# Patient Record
Sex: Female | Born: 1985 | Race: Black or African American | Hispanic: No | Marital: Single | Smoking: Current every day smoker
Health system: Southern US, Community
[De-identification: ages and names within clinical notes are randomized; demographics above are authoritative.]

## PROBLEM LIST (undated history)

## (undated) DIAGNOSIS — F23 Brief psychotic disorder: Secondary | ICD-10-CM

## (undated) DIAGNOSIS — F319 Bipolar disorder, unspecified: Secondary | ICD-10-CM

## (undated) DIAGNOSIS — F4481 Dissociative identity disorder: Secondary | ICD-10-CM

---
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
## 2014-03-07 ENCOUNTER — Emergency Department (HOSPITAL_COMMUNITY)
Admission: EM | Admit: 2014-03-07 | Discharge: 2014-03-07 | Disposition: A | Discharge status: Home / Self Care | Payer: Self-pay | Attending: Emergency Medicine | Admitting: Emergency Medicine

## 2014-03-07 ENCOUNTER — Encounter (HOSPITAL_COMMUNITY): Payer: Self-pay | Admitting: Emergency Medicine

## 2014-03-07 DIAGNOSIS — K0889 Other specified disorders of teeth and supporting structures: Secondary | ICD-10-CM

## 2014-03-07 DIAGNOSIS — K089 Disorder of teeth and supporting structures, unspecified: Secondary | ICD-10-CM | POA: Insufficient documentation

## 2014-03-07 DIAGNOSIS — K029 Dental caries, unspecified: Secondary | ICD-10-CM | POA: Insufficient documentation

## 2014-03-07 DIAGNOSIS — F17200 Nicotine dependence, unspecified, uncomplicated: Secondary | ICD-10-CM | POA: Insufficient documentation

## 2014-03-07 MED ORDER — PROMETHAZINE HCL 25 MG PO TABS: 25.0000 mg | ORAL_TABLET | Freq: Four times a day (QID) | ORAL | Status: DC | PRN

## 2014-03-07 MED ORDER — PENICILLIN V POTASSIUM 500 MG PO TABS: 500.0000 mg | ORAL_TABLET | Freq: Four times a day (QID) | ORAL | Status: DC

## 2014-03-07 MED ORDER — OXYCODONE-ACETAMINOPHEN 5-325 MG PO TABS: 1.0000 | ORAL_TABLET | Freq: Four times a day (QID) | ORAL | Status: DC | PRN

## 2014-03-07 MED ORDER — CLINDAMYCIN HCL 150 MG PO CAPS: 150.0000 mg | ORAL_CAPSULE | Freq: Three times a day (TID) | ORAL | Status: DC

## 2014-03-07 NOTE — ED Provider Notes (Signed)
CSN: [id number]     Arrival date & time 03/07/14  1710 History  This chart was scribed for non-physician practitioner Junious SilkHannah Venna Berberich, PA-C working with Toy BakerAnthony T Allen, MD by Joaquin MusicKristina Sanchez-Matthews, ED Scribe. This patient was seen in room TR06C/TR06C and the patient's care was started at 5:28 PM .   Chief Complaint  Patient presents with  . Dental Pain   The history is provided by the patient. No language interpreter was used.   HPI Comments: Alisha Drake is a 28 y.o. female who presents to the Emergency Department complaining of ongoing 2 upper L sided dental pain that began last night. She states she has a cavity to the tooth that is causing dental pain and suspects she now has an infection. States she is having L face pain due to pain that she states feels she has an infection. Describes the pain as sharp PTA but states her current pain is constant and dull. Pt states heat and cold worsens the pain. Reports not being able to eat due to pain although she has been using OTC Orajel but denies any relief. Pt has allergies to Penicillin. Denies fever, chills, nausea, emesis, CP, SOB, trouble swallowing, drooling, and having a dentist at this time.  History reviewed. No pertinent past medical history. History reviewed. No pertinent past surgical history. History reviewed. No pertinent family history. History  Substance Use Topics  . Smoking status: Current Some Day Smoker  . Smokeless tobacco: Not on file  . Alcohol Use: No   OB History   Grav Para Term Preterm Abortions TAB SAB Ect Mult Living                 Review of Systems  HENT: Positive for dental problem. Negative for drooling and trouble swallowing.   Respiratory: Negative for shortness of breath.   Cardiovascular: Negative for chest pain.  Gastrointestinal: Negative for nausea and vomiting.  All other systems reviewed and are negative.   Allergies  Review of patient's allergies indicates not on file.  Home  Medications   Prior to Admission medications   Not on File   BP 145/98  Pulse 90  Temp(Src) 98.3 F (36.8 C) (Oral)  Resp 18  Ht 5\' 3"  (1.6 m)  Wt 284 lb (128.822 kg)  BMI 50.32 kg/m2  SpO2 100%  LMP 03/01/2014  Physical Exam  Nursing note and vitals reviewed. Constitutional: She is oriented to person, place, and time. She appears well-developed and well-nourished. No distress.  HENT:  Head: Normocephalic and atraumatic.  Right Ear: External ear normal.  Left Ear: External ear normal.  Nose: Nose normal.  Mouth/Throat: Uvula is midline and oropharynx is clear and moist. No trismus in the jaw. Abnormal dentition. Dental caries present.    Poor dentition. No trismus, submental edema, or tongue elevation. No drainable abscess.  Eyes: Conjunctivae are normal.  Neck: Normal range of motion.  Cardiovascular: Normal rate, regular rhythm and normal heart sounds.   Pulmonary/Chest: Effort normal and breath sounds normal. No stridor. No respiratory distress. She has no wheezes. She has no rales.  Abdominal: Soft. She exhibits no distension.  Musculoskeletal: Normal range of motion.  Neurological: She is alert and oriented to person, place, and time. She has normal strength.  Skin: Skin is warm and dry. She is not diaphoretic. No erythema.  Psychiatric: She has a normal mood and affect. Her behavior is normal.   ED Course  Procedures DIAGNOSTIC STUDIES: Oxygen Saturation is 98% on RA, normal  by my interpretation.    COORDINATION OF CARE: 5:31 PM-Discussed treatment plan which includes dental block. Pt agreed to plan.   5:35 PM-Dental Performed by: Junious SilkHannah Jemeka Wagler, PA-C Authorized by: Junious SilkHannah Korbin Mapps, PA-C Consent: Verbal consent obtained. Patient understanding: patient states understanding of the procedure being performed Patient identity confirmed: verbally with patient Local anesthesia used: yes Local anesthetic: bupivacaine 0.5% with epinephrine Anesthetic total: 3.6  ml Patient sedated: no Patient tolerance: Patient tolerated the procedure well with no immediate complications.  5:50 PM-[facility]. Will discharge pt with Clindamycin and Percocet. Pt agreed to plan.  Labs Review Labs Reviewed - No data to display  Imaging Review No results found.   EKG Interpretation None     MDM   Final diagnoses:  Pain, dental    Patient with toothache.  No gross abscess.  Exam unconcerning for Ludwig's angina or spread of infection.  Will treat with penicillin and pain medicine.  Urged patient to follow-up with dentist.    I personally performed the services described in this documentation, which was scribed in my presence. The recorded information has been reviewed and is accurate.    Mora BellmanHannah S Leticia Mcdiarmid, PA-C 03/07/14 [id number]

## 2014-03-07 NOTE — Discharge Instructions (Signed)
Dental Pain °A tooth ache may be caused by cavities (tooth decay). Cavities expose the nerve of the tooth to air and hot or cold temperatures. It may come from an infection or abscess (also called a boil or furuncle) around your tooth. It is also often caused by dental caries (tooth decay). This causes the pain you are having. °DIAGNOSIS  °Your caregiver can diagnose this problem by exam. °TREATMENT  °· If caused by an infection, it may be treated with medications which kill germs (antibiotics) and pain medications as prescribed by your caregiver. Take medications as directed. °· Only take over-the-counter or prescription medicines for pain, discomfort, or fever as directed by your caregiver. °· Whether the tooth ache today is caused by infection or dental disease, you should see your dentist as soon as possible for further care. °SEEK MEDICAL CARE IF: °The exam and treatment you received today has been provided on an emergency basis only. This is not a substitute for complete medical or dental care. If your problem worsens or new problems (symptoms) appear, and you are unable to meet with your dentist, call or return to this location. °SEEK IMMEDIATE MEDICAL CARE IF:  °· You have a fever. °· You develop redness and swelling of your face, jaw, or neck. °· You are unable to open your mouth. °· You have severe pain uncontrolled by pain medicine. °MAKE SURE YOU:  °· Understand these instructions. °· Will watch your condition. °· Will get help right away if you are not doing well or get worse. °Document Released: 10/27/2005 Document Revised: 01/19/2012 Document Reviewed: 06/14/2008 °ExitCare® Patient Information ©2014 [employer]. °  Emergency Department Resource Guide °1) Find a Doctor and Pay Out of Pocket °Although you won't have to find out who is covered by your insurance plan, it is a good idea to ask around and get recommendations. You will then need to call the office and see if the doctor you have chosen will  accept you as a new patient and what types of options they offer for patients who are self-pay. Some doctors offer discounts or will set up payment plans for their patients who do not have insurance, but you will need to ask so you aren't surprised when you get to your appointment. ° °2) Contact Your Local Health Department °Not all health departments have doctors that can see patients for sick visits, but many do, so it is worth a call to see if yours does. If you don't know where your local health department is, you can check in your phone book. The CDC also has a tool to help you locate your state's health department, and many state websites also have listings of all of their local health departments. ° °3) Find a Walk-in Clinic °If your illness is not likely to be very severe or complicated, you may want to try a walk in clinic. These are popping up all over the country in pharmacies, drugstores, and shopping centers. They're usually staffed by nurse practitioners or physician assistants that have been trained to treat common illnesses and complaints. They're usually fairly quick and inexpensive. However, if you have serious medical issues or chronic medical problems, these are probably not your best option. ° °No Primary Care Doctor: °- Call [facility] at  832-8000 - they can help you locate a primary care doctor that  accepts your insurance, provides certain services, etc. °- Physician Referral Service- 1-800-533-3463 ° °Chronic Pain Problems: °Organization           Address  Phone   Notes  °[facility]  (336) 297-2271 Patients need to be referred by their primary care doctor.  ° °Medication Assistance: °Organization         Address  Phone   Notes  °[place] [facility] [address] °[place], [place] [address] (336) 641-8030 --Must be a resident of [place] °-- Must have NO insurance coverage whatsoever (no Medicaid/ Medicare, etc.) °-- The pt.  MUST have a primary care doctor that directs their care regularly and follows them in the community °  °[facility]  (866) 331-1348   °[employer]  (888) 892-1162   ° °Agencies that provide inexpensive medical care: °Organization         Address  Phone   Notes  °[facility]  (336) 832-8035   °[facility]    (336) 832-7272   °[facility] [address] °[place], [place] [address] (336) 832-4777   °[facility] [address], °[place] (336) 271-4999   °[facility]    (336) 373-0678   °[facility]    (336) 272-1050   °[facility] ° [address], [place] Phone:  (336) 832-4444, Fax:  (336) 832-4440 Hours of Operation:  9 am - 6 pm, M-F.  Also accepts Medicaid/Medicare and self-pay.  °[facility] ° [address], [place] Phone: (336) 832-3150, Fax: (336) 832-3151. Hours of Operation:  8:30 am - 5:30 pm, M-F.  Also accepts Medicaid and self-pay.  °[facility] [place] [address], [place] Phone: (336) 878-6027   °[facility] [address], [place], [place] (336)723-1848, Ext. 123 Mondays & Thursdays: 7-9 AM.  First 15 patients are seen on a first come, first serve basis. °  ° °Medicaid-accepting [place] Providers: ° °Organization         Address  Phone   Notes  °[facility] [address], [place] (336) 641-2100 Also accepts self-pay patients.  °[facility] [address], [place] ° (336) 856-9996   °[facility] [address], [place] (336) 288-8857   °[facility] [address] [place] [address], [place] (336) 299-7000   °Veita Bland [address], [place]  ° (336) 373-1557 Only accepts [place] Access Medicaid patients after they have their name applied to their card.  ° °Self-Pay (no insurance) in  [place]: ° °Organization         Address  Phone   Notes  °Sickle Cell Patients, [facility] [address], [place] (336) 832-1970   °[facility] [address], [place] (336) 832-4400   °[facility] ° [address], [place] (336) 992-4800   °[facility]/Dr. Osei-Bonsu ° [address] [place] [address], [place] or [address], [place] (336) 841-8500 Phone number for both [place] and [place] locations is the same.  °[facility] [address], [place] (336) 299-0000   °[facility] [address] [place] [address],  or [address] (336) 852-7530 °(336) 878-2260   °[facility] [address],  (336) 350-1642, phone; (336) 294-5005, fax Sees patients 1st and 3rd Saturday of every month.  Must not qualify   for public or private insurance (i.e. Medicaid, Medicare, [place] Health Choice, Veterans' Benefits) • Household income should be no more than 200% of the poverty level •The clinic cannot treat you if you are pregnant or think you are pregnant • Sexually transmitted diseases are not treated at the clinic.  ° °Dental Care: °Organization         Address  Phone  Notes  °[facility] [address], [place] (336) 641-6152 Accepts children up to age 21 who are enrolled in Medicaid or [place] Health Choice; pregnant women with a Medicaid card; and children who have applied for Medicaid or [place] Health Choice, but were declined, whose parents can pay a reduced fee at time of service.  °[facility] [place]  [address], [place] (336) 641-7733 Accepts children up to age 21 who are enrolled in Medicaid or [place] Health Choice; pregnant women with a Medicaid card; and children who have applied for Medicaid or [place] Health Choice, but were declined, whose parents  can pay a reduced fee at time of service.  °[facility] ° [address], [place] (336) 641-4533 Patients are seen by appointment only. Walk-ins are not accepted. [facility] will see patients 18 years of age and older. °Monday - Tuesday (8am-5pm) °Most Wednesdays (8:30-5pm) °$30 per visit, cash only  °[facility] ° [address], [place] (336) 641-4533 Patients are seen by appointment only. Walk-ins are not accepted. [facility] will see patients 18 years of age and older. °One Wednesday Evening (Monthly: Volunteer Based).  $30 per visit, cash only  °[facility]  (919) 537-3737 for adults; Children under age 4, call Graduate Pediatric Dentistry at (919) [phone]. Children aged 4-14, please call (919) [phone] to request a pediatric application. ° Dental services are provided in all areas of dental care including fillings, crowns and bridges, complete and partial dentures, implants, gum treatment, root canals, and extractions. Preventive care is also provided. Treatment is provided to both adults and children. °Patients are selected via a lottery and there is often a waiting list. °  °[facility] [address], °[place] ° (336) 763-8833 [email or web address] °  °[facility] [address], [place], [place] (336)723-1848, Ext. 123 Second and Fourth Thursday of each month, opens at 6:30 AM; Clinic ends at 9 AM.  Patients are seen on a first-come first-served basis, and a limited number are seen during each clinic.  ° °[facility] ° [address], [place], [place] (336) 723-7904   Eligibility Requirements °You must have lived in [place], [place], or [place] counties for at least the last three months. °  You cannot be eligible for state or federal sponsored healthcare insurance, including Veterans Administration, Medicaid, or Medicare. °  You generally cannot be eligible for healthcare  insurance through your employer.  °  How to apply: °Eligibility screenings are held every Tuesday and Wednesday afternoon from 1:00 pm until 4:00 pm. You do not need an appointment for the interview!  °[facility] [address], [place], [place] 336-631-2330   °[place] [facility]  336-342-8273   °[place] [facility]  336-703-3100   °[facility]  336-570-6415   ° °Behavioral Health Resources in the Community: °Intensive Outpatient Programs °Organization         Address  Phone    Notes  °[facility] [address], [place], [place] 336-878-6098   °[facility] [address], [place], [place] 336-832-9800   °[facility] [address], [place], [place] ° 336-882-2125   °[facility] [address],  °[place], [place] 1-800-853-5163 or 336-641-4981   °Substance Abuse Resources °Organization         Address  Phone  Notes  °[facility]  336-882-2125   °[facility]  336-784-9470   °[facility]  336-285-9073   °[facility]  336-845-3988   °Residential & Outpatient Substance Abuse Program  1-800-659-3381   °Psychological Services °Organization         Address  Phone  Notes  °[facility]  336- 832-9600   °[facility]  336- 378-7881   °[facility] [address], [place] 1-800-853-5163 or 336-641-4981   ° °Mobile Crisis Teams °Organization         Address  Phone  Notes  °[facility], Mobile Crisis Care Unit  1-877-626-1772   °Assertive °[facility] ° [address] [place], [place] 336-834-9664   °Sharon DeEsch [address] °[place] 336-554-5454   ° °Self-Help/Support Groups °Organization         Address  Phone             Notes  °[facility] [place] - variety of support groups  336- 373-1402 Call for more information  °Narcotics Anonymous (NA),  [facility] [address], °[place]  2 meetings at this location  ° °Residential Treatment Programs °Organization         Address  Phone  Notes  °[facility] [address],    °[place]  1-866-801-8205   °[facility] ° [address], [place], [place] 704-293-8524   °[facility] [address], [place] 336-845-3988 Admissions: 8am-3pm M-F  °[facility] [address]    °[place], [place] 336-841-1104   °[facility] [address], [place], [place] 336-379-7146   °[facility] [address]  °[place], [place] 336-285-9073   °[facility] - Intensive Outpatient [address], [place], [place] 336-852-3033   °[facility] [address]  °[place], [place] 1-877-615-2722 or 336-784-9470   °[facility] ([facility] [address] [place], [place] 336-227-7417 Accepts Medicaid  °[facility] [address]  ° [place] 1-800-659-3381 Substance Abuse/Addiction Treatment  ° °[place] Behavioral Health Resources °Organization         Address  Phone  Notes  °[facility]  (888) 581-9988   °Julie Brannon, PhD [address] [place], [place]   (336) 349-5553 or (336) 951-0000   °[facility]   [address] °[place], [place] (336) 349-4454   °[facility] [address], [place], [place] (336) 342-8316 Insurance/Medicaid/sponsorship through [facility]  °[facility] [address]                                    [place], [place] (336) 342-8316 Therapy/tele-psych/case  °[facility] [address]  ° [place],  (336) 349-2233    °Dr. Arfeen  (336) 349-4544   °[facility] [place]  [employer] [place]   [facility] 1) [address], [place] °2) [address], [place] °3)  [address], [place] (336) 349-3220 °(336) 342-7768 ° °(336) 342-8140     °[place] [facility] Abuse [facility] (336) 342-1394 or (336) 342-3537 (After Hours)    ° °   °

## 2014-03-07 NOTE — ED Notes (Signed)
Pt here for toothache, pt sts that onset last night, reports multiple teeth affected on left side upper and lower. Has not been able to follow up with dentist.

## 2014-03-08 NOTE — ED Provider Notes (Signed)
Medical screening examination/treatment/procedure(s) were performed by non-physician practitioner and as supervising physician I was immediately available for consultation/collaboration.  Shann Lewellyn T Asusena Sigley, MD 03/08/14 2253 

## 2014-04-10 ENCOUNTER — Emergency Department (HOSPITAL_COMMUNITY)
Admission: EM | Admit: 2014-04-10 | Discharge: 2014-04-10 | Disposition: A | Discharge status: Home / Self Care | Payer: Self-pay | Attending: Emergency Medicine | Admitting: Emergency Medicine

## 2014-04-10 ENCOUNTER — Encounter (HOSPITAL_COMMUNITY): Payer: Self-pay | Admitting: Emergency Medicine

## 2014-04-10 DIAGNOSIS — Z3202 Encounter for pregnancy test, result negative: Secondary | ICD-10-CM | POA: Insufficient documentation

## 2014-04-10 DIAGNOSIS — F17200 Nicotine dependence, unspecified, uncomplicated: Secondary | ICD-10-CM | POA: Insufficient documentation

## 2014-04-10 DIAGNOSIS — N920 Excessive and frequent menstruation with regular cycle: Secondary | ICD-10-CM | POA: Insufficient documentation

## 2014-04-10 LAB — CBC WITH DIFFERENTIAL/PLATELET
Basophils Absolute: 0 10*3/uL (ref 0.0–0.1)
Basophils Relative: 0 % (ref 0–1)
Eosinophils Absolute: 0.2 10*3/uL (ref 0.0–0.7)
Eosinophils Relative: 3 % (ref 0–5)
HEMATOCRIT: 35.2 % — AB (ref 36.0–46.0)
HEMOGLOBIN: 12.6 g/dL (ref 12.0–15.0)
LYMPHS ABS: 4 10*3/uL (ref 0.7–4.0)
LYMPHS PCT: 43 % (ref 12–46)
MCH: 26.6 pg (ref 26.0–34.0)
MCHC: 35.8 g/dL (ref 30.0–36.0)
MCV: 74.3 fL — AB (ref 78.0–100.0)
MONO ABS: 0.6 10*3/uL (ref 0.1–1.0)
Monocytes Relative: 6 % (ref 3–12)
NEUTROS ABS: 4.5 10*3/uL (ref 1.7–7.7)
Neutrophils Relative %: 48 % (ref 43–77)
Platelets: 309 10*3/uL (ref 150–400)
RBC: 4.74 MIL/uL (ref 3.87–5.11)
RDW: 15.5 % (ref 11.5–15.5)
WBC: 9.3 10*3/uL (ref 4.0–10.5)

## 2014-04-10 LAB — WET PREP, GENITAL
Trich, Wet Prep: NONE SEEN
Yeast Wet Prep HPF POC: NONE SEEN

## 2014-04-10 LAB — GC/CHLAMYDIA PROBE AMP
CT PROBE, AMP APTIMA: NEGATIVE
GC Probe RNA: NEGATIVE

## 2014-04-10 LAB — POC URINE PREG, ED: PREG TEST UR: NEGATIVE

## 2014-04-10 NOTE — ED Provider Notes (Signed)
CSN: [id number]     Arrival date & time 04/10/14  0351 History   First MD Initiated Contact with Patient 04/10/14 [phone]     Chief Complaint  Patient presents with  . Vaginal Bleeding     (Consider location/radiation/quality/duration/timing/severity/associated sxs/prior Treatment) Patient is a 28 y.o. female presenting with vaginal bleeding. The history is provided by the patient.  Vaginal Bleeding Quality:  Dark red Severity:  Moderate Onset quality:  Gradual Duration:  3 weeks Timing:  Constant Progression:  Unchanged Chronicity:  New Menstrual history:  Irregular Possible pregnancy: no   Context: not genital trauma   Relieved by:  Nothing Worsened by:  Nothing tried Ineffective treatments:  None tried Associated symptoms: no abdominal pain and no fever     History reviewed. No pertinent past medical history. History reviewed. No pertinent past surgical history. No family history on file. History  Substance Use Topics  . Smoking status: Current Every Day Smoker  . Smokeless tobacco: Not on file  . Alcohol Use: No   OB History   Grav Para Term Preterm Abortions TAB SAB Ect Mult Living                 Review of Systems  Constitutional: Negative for fever.  Gastrointestinal: Negative for abdominal pain.  Genitourinary: Positive for vaginal bleeding.  All other systems reviewed and are negative.     Allergies  Penicillins  Home Medications   Prior to Admission medications   Not on File   BP 134/78  Pulse 87  Temp(Src) 98.1 F (36.7 C)  Resp 18  Ht 5\' 3"  (1.6 m)  Wt 295 lb (133.811 kg)  BMI 52.27 kg/m2  SpO2 98%  LMP 01/08/2014 Physical Exam  Constitutional: She is oriented to person, place, and time. She appears well-developed and well-nourished. No distress.  HENT:  Head: Normocephalic and atraumatic.  Eyes: Conjunctivae and EOM are normal.  Neck: Normal range of motion. Neck supple.  Cardiovascular: Normal rate, regular rhythm and intact distal  pulses.   Pulmonary/Chest: Effort normal and breath sounds normal. She has no wheezes. She has no rales.  Abdominal: Soft. Bowel sounds are normal. There is no tenderness. There is no rebound and no guarding.  Genitourinary:  Chaperone present scant vaginal bleeding chaperone present  Musculoskeletal: Normal range of motion.  Neurological: She is alert and oriented to person, place, and time.  Skin: Skin is warm and dry.    ED Course  Procedures (including critical care time) Labs Review Labs Reviewed  WET PREP, GENITAL - Abnormal; Notable for the following:    Clue Cells Wet Prep HPF POC FEW (*)    WBC, Wet Prep HPF POC FEW (*)    All other components within normal limits  GC/CHLAMYDIA PROBE AMP  CBC WITH DIFFERENTIAL  POC URINE PREG, ED    Imaging Review No results found.   EKG Interpretation None      MDM   Final diagnoses:  None   Follow up with GYN for ongoing care    Amela Handley K Cheryl Chay-Rasch, MD 04/10/14 [phone]

## 2014-04-10 NOTE — ED Notes (Signed)
The pt is c/o vaginal bleeding for 3 weeks.  lmp march.  Minor pain in abd and back

## 2017-07-03 ENCOUNTER — Emergency Department (HOSPITAL_COMMUNITY)
Admission: EM | Admit: 2017-07-03 | Discharge: 2017-07-03 | Disposition: A | Discharge status: Home / Self Care | Payer: Medicaid Other | Attending: Emergency Medicine | Admitting: Emergency Medicine

## 2017-07-03 ENCOUNTER — Emergency Department (HOSPITAL_COMMUNITY): Payer: Medicaid Other

## 2017-07-03 ENCOUNTER — Encounter (HOSPITAL_COMMUNITY): Payer: Self-pay | Admitting: Emergency Medicine

## 2017-07-03 DIAGNOSIS — Y929 Unspecified place or not applicable: Secondary | ICD-10-CM | POA: Insufficient documentation

## 2017-07-03 DIAGNOSIS — J3489 Other specified disorders of nose and nasal sinuses: Secondary | ICD-10-CM | POA: Diagnosis not present

## 2017-07-03 DIAGNOSIS — Y33XXXA Other specified events, undetermined intent, initial encounter: Secondary | ICD-10-CM | POA: Diagnosis not present

## 2017-07-03 DIAGNOSIS — F17200 Nicotine dependence, unspecified, uncomplicated: Secondary | ICD-10-CM | POA: Insufficient documentation

## 2017-07-03 DIAGNOSIS — R0602 Shortness of breath: Secondary | ICD-10-CM | POA: Diagnosis present

## 2017-07-03 DIAGNOSIS — M79601 Pain in right arm: Secondary | ICD-10-CM

## 2017-07-03 DIAGNOSIS — Y939 Activity, unspecified: Secondary | ICD-10-CM | POA: Insufficient documentation

## 2017-07-03 DIAGNOSIS — Y998 Other external cause status: Secondary | ICD-10-CM | POA: Insufficient documentation

## 2017-07-03 DIAGNOSIS — S161XXA Strain of muscle, fascia and tendon at neck level, initial encounter: Secondary | ICD-10-CM | POA: Diagnosis not present

## 2017-07-03 LAB — BASIC METABOLIC PANEL
Anion gap: 10 (ref 5–15)
BUN: 12 mg/dL (ref 6–20)
CALCIUM: 9.3 mg/dL (ref 8.9–10.3)
CO2: 25 mmol/L (ref 22–32)
Chloride: 103 mmol/L (ref 101–111)
Creatinine, Ser: 0.54 mg/dL (ref 0.44–1.00)
GFR calc non Af Amer: >60 mL/min (ref >60)
Glucose, Bld: 112 mg/dL — ABNORMAL HIGH (ref 65–99)
Potassium: 3.8 mmol/L (ref 3.5–5.1)
Sodium: 138 mmol/L (ref 135–145)

## 2017-07-03 LAB — CBC
HCT: 39.1 % (ref 36.0–46.0)
Hemoglobin: 14.2 g/dL (ref 12.0–15.0)
MCH: 27.2 pg (ref 26.0–34.0)
MCHC: 36.3 g/dL — AB (ref 30.0–36.0)
MCV: 74.9 fL — ABNORMAL LOW (ref 78.0–100.0)
Platelets: 307 10*3/uL (ref 150–400)
RBC: 5.22 MIL/uL — ABNORMAL HIGH (ref 3.87–5.11)
RDW: 14.2 % (ref 11.5–15.5)
WBC: 8.4 10*3/uL (ref 4.0–10.5)

## 2017-07-03 LAB — I-STAT TROPONIN, ED: TROPONIN I, POC: 0 ng/mL (ref 0.00–0.08)

## 2017-07-03 NOTE — ED Provider Notes (Signed)
[facility] DEPT Provider Note   CSN: [id number] Arrival date & time: 07/03/17  4098     History   Chief Complaint Chief Complaint  Patient presents with  . Shortness of Breath  . Generalized Body Aches    HPI Alisha Drake is a 31 y.o. female.  31 yo F with a chief complaint of shortness of breath. This been going on for the past 2 months or so. Patient normally wakes up in the middle night and feels like she's having trouble breathing. Usually coughs up some sputum and feels better. Had the same thing this morning. She also has been having neck pain going on for the past 6 months or so. Starts at the base of her C-spine and she feels it radiates down her back. Also having some right arm pain. Worse with movement palpation twisting. Denies fever chills. Denies prior history of heart failure. Denies smoking. Denies history of sleep apnea. Recently moved to the area and does not have a physician.   The history is provided by the patient.  Shortness of Breath  This is a new problem. The average episode lasts 2 months. The problem occurs continuously.The current episode started less than 1 hour ago. The problem has not changed since onset.Associated symptoms include cough. Pertinent negatives include no fever, no headaches, no rhinorrhea, no wheezing, no chest pain, no vomiting and no abdominal pain. She has tried nothing for the symptoms. The treatment provided no relief. Associated medical issues do not include asthma, COPD or PE.    History reviewed. No pertinent past medical history.  There are no active problems to display for this patient.   History reviewed. No pertinent surgical history.  OB History    No data available       Home Medications    Prior to Admission medications   Not on File    Family History History reviewed. No pertinent family history.  Social History Social History  Substance Use Topics  . Smoking status: Current Every Day Smoker  .  Smokeless tobacco: Not on file  . Alcohol use No     Allergies   Penicillins; Penicillins; and Shellfish allergy   Review of Systems Review of Systems  Constitutional: Negative for chills and fever.  HENT: Positive for congestion. Negative for rhinorrhea.   Eyes: Negative for redness and visual disturbance.  Respiratory: Positive for cough and shortness of breath. Negative for wheezing.   Cardiovascular: Negative for chest pain and palpitations.  Gastrointestinal: Negative for abdominal pain, nausea and vomiting.  Genitourinary: Negative for dysuria and urgency.  Musculoskeletal: Negative for arthralgias and myalgias.  Skin: Negative for pallor and wound.  Neurological: Negative for dizziness and headaches.     Physical Exam Updated Vital Signs BP 127/84 (BP Location: Right Arm)   Pulse 77   Temp 98.3 F (36.8 C) (Oral)   Resp 16   Ht 5\' 5"  (1.651 m)   Wt 133.8 kg (295 lb)   SpO2 99%   BMI 49.09 kg/m   Physical Exam  Constitutional: She is oriented to person, place, and time. She appears well-developed and well-nourished. No distress.  obese  HENT:  Head: Normocephalic and atraumatic.  Swollen turbinates, posterior nasal drip, no noted sinus ttp, tm normal bilaterally.    Eyes: Pupils are equal, round, and reactive to light. EOM are normal.  Neck: Normal range of motion. Neck supple.  Cardiovascular: Normal rate and regular rhythm.  Exam reveals no gallop and no friction rub.  No murmur heard. Pulmonary/Chest: Effort normal. She has no wheezes. She has no rales.  Abdominal: Soft. She exhibits no distension and no mass. There is no tenderness. There is no guarding.  Musculoskeletal: She exhibits no edema or tenderness.  Full range of motion of the right upper extremity. No focal areas of tenderness.  Tender to palpation about C7 worse on the right paraspinal musculature. Reproduces her symptoms.  Neurological: She is alert and oriented to person, place, and time.   Skin: Skin is warm and dry. She is not diaphoretic.  Psychiatric: She has a normal mood and affect. Her behavior is normal.  Nursing note and vitals reviewed.    ED Treatments / Results  Labs (all labs ordered are listed, but only abnormal results are displayed) Labs Reviewed  BASIC METABOLIC PANEL - Abnormal; Notable for the following:       Result Value   Glucose, Bld 112 (*)    All other components within normal limits  CBC - Abnormal; Notable for the following:    RBC 5.22 (*)    MCV 74.9 (*)    MCHC 36.3 (*)    All other components within normal limits  I-STAT TROPONIN, ED    EKG  EKG Interpretation  Date/Time:  Friday July 03 2017 06:45:50 EDT Ventricular Rate:  79 PR Interval:  166 QRS Duration: 90 QT Interval:  384 QTC Calculation: 440 R Axis:   53 Text Interpretation:  Normal sinus rhythm Normal ECG No old tracing to compare Confirmed by Melene Plan [phone]) on 07/03/2017 9:40:51 AM       Radiology Dg Chest 2 View  Result Date: 07/03/2017 CLINICAL DATA:  Shortness of Breath EXAM: CHEST  2 VIEW COMPARISON:  None. FINDINGS: Lungs are clear. Heart size and pulmonary vascularity are normal. No adenopathy. No pneumothorax. No bone lesions. IMPRESSION: No edema or consolidation. Electronically Signed   By: Bretta Bang III M.D.   On: 07/03/2017 07:14    Procedures Procedures (including critical care time)  Medications Ordered in ED Medications - No data to display   Initial Impression / Assessment and Plan / ED Course  I have reviewed the triage vital signs and the nursing notes.  Pertinent labs & imaging results that were available during my care of the patient were reviewed by me and considered in my medical decision making (see chart for details).     31 yo F With multiple complaints. Seems the patient has a chronic neck strain based on exam. Will treat conservatively with NSAIDs. She has been having what sounds like orthopnea though the patient is  significantly overweight and appears to have likely chronic posterior nasal drip. I think that this is likely allergies versus sleep apnea. She has no rales no abnormal heart sounds and no lower extremity edema. I suggested that the patient needs to follow-up with a primary care provider.  10:21 AM:  I have discussed the diagnosis/risks/treatment options with the patient and believe the pt to be eligible for discharge home to follow-up with PCP. We also discussed returning to the ED immediately if new or worsening sx occur. We discussed the sx which are most concerning (e.g., sudden worsening pain, fever, inability to tolerate by mouth) that necessitate immediate return. Medications administered to the patient during their visit and any new prescriptions provided to the patient are listed below.  Medications given during this visit Medications - No data to display   The patient appears reasonably screen and/or stabilized for discharge and  I doubt any other medical condition or other [facility] requiring further screening, evaluation, or treatment in the ED at this time prior to discharge.    Final Clinical Impressions(s) / ED Diagnoses   Final diagnoses:  Neck strain, initial encounter  Rhinorrhea  Right arm pain    New Prescriptions New Prescriptions   No medications on file     Melene Plan, DO 07/03/17 1021

## 2017-07-03 NOTE — Discharge Instructions (Signed)
Take 4 over the counter ibuprofen tablets 3 times a day or 2 over-the-counter naproxen tablets twice a day for pain. Also take tylenol 1000mg (2 extra strength) four times a day.    Try Claritin, zyrtec, or allegra for allergies.  Try zantac for reflux.

## 2017-07-03 NOTE — ED Triage Notes (Signed)
Pt c/o increase sob and generalized body ache for the past few days getting worse today. No fever or chills, no nausea or vomiting.

## 2017-08-23 ENCOUNTER — Emergency Department (HOSPITAL_COMMUNITY): Payer: Medicaid Other

## 2017-08-23 ENCOUNTER — Encounter (HOSPITAL_COMMUNITY): Payer: Self-pay | Admitting: *Deleted

## 2017-08-23 ENCOUNTER — Emergency Department (HOSPITAL_COMMUNITY)
Admission: EM | Admit: 2017-08-23 | Discharge: 2017-08-23 | Disposition: A | Discharge status: Home / Self Care | Payer: Medicaid Other | Attending: Emergency Medicine | Admitting: Emergency Medicine

## 2017-08-23 DIAGNOSIS — J069 Acute upper respiratory infection, unspecified: Secondary | ICD-10-CM | POA: Insufficient documentation

## 2017-08-23 DIAGNOSIS — F17200 Nicotine dependence, unspecified, uncomplicated: Secondary | ICD-10-CM | POA: Diagnosis not present

## 2017-08-23 DIAGNOSIS — L853 Xerosis cutis: Secondary | ICD-10-CM | POA: Insufficient documentation

## 2017-08-23 DIAGNOSIS — B349 Viral infection, unspecified: Secondary | ICD-10-CM | POA: Diagnosis not present

## 2017-08-23 DIAGNOSIS — R05 Cough: Secondary | ICD-10-CM | POA: Diagnosis present

## 2017-08-23 MED ORDER — TRIAMCINOLONE ACETONIDE 0.025 % EX OINT: 1.0000 "application " | TOPICAL_OINTMENT | Freq: Two times a day (BID) | CUTANEOUS | 0 refills | Status: DC

## 2017-08-23 NOTE — Discharge Instructions (Signed)
The chest x-ray in the ER today was normal. No evidence of pneumonia. You're having a viral upper respiratory tract infection. Please treat this symptomatically with over-the-counter cough medicines.   The rash they have over your forearms and lower legs is related to dry skin. Please apply non-scented hydrating lotions and creams to these areas daily. I have also given you a prescription for a steroid cream which you can use on your right outer hand which is inflamed likely from you wearing gloves at work. Please apply this twice a day.   Please return to the emergency department if you develop fever with rash, cough in which you have trouble breathing or have any new or worsening symptoms.

## 2017-08-23 NOTE — ED Notes (Signed)
See EDP secondary assessment.  

## 2017-08-23 NOTE — ED Provider Notes (Signed)
[facility] Provider Note   CSN: [id number] Arrival date & time: 08/23/17  1018     History   Chief Complaint Chief Complaint  Patient presents with  . Cough  . Nasal Congestion  . Rash    HPI Alisha Drake is a 31 y.o. female.  HPI  Alisha Drake is a 31 year old female with a history of tobacco use who presents to the emergency department for evaluation of cough and rash over her bilateral forearms, ankles and feet. Patient states that approximately one week ago she developed a productive cough with clear mucus, congestion, sore throat and bilateral ear fullness. Since that time her symptoms have resolved, except she states that she had one episode yesterday where she was laughing and coughed up some clear mucus while at work. She works in [employer]nel [employer] and her work told her that she needed to go to the hospital to get evaluated before being able to return. She denies fever, shortness of breath, chest tightness, wheezing, ear drainage, nausea/vomiting, abdominal pain. She has several children at home who have also been sick recently.   She also states that she has had several months of dry skin over her bilateral forearms and bilateral ankles and feet. She states that she is busy with her job and several children at home and she has not been able to apply hydrating lotion to the area. She states that in the past she used Vaseline and hydrocortisone cream which improved her symptoms. She does have one area on the right hand which is erythematous and itchy. She noticed that it has worsened since she had to wear gloves at work. No fever, joint pain, purulence.   History reviewed. No pertinent past medical history.  There are no active problems to display for this patient.   History reviewed. No pertinent surgical history.  OB History    No data available       Home Medications    Prior to Admission medications   Medication Sig Start Date End Date Taking? Authorizing  Provider  triamcinolone (KENALOG) 0.025 % ointment Apply 1 application topically 2 (two) times daily. 08/23/17   Kellie Shropshire, PA-C    Family History No family history on file.  Social History Social History  Substance Use Topics  . Smoking status: Current Every Day Smoker  . Smokeless tobacco: Never Used  . Alcohol use No     Allergies   Penicillins; Penicillins; and Shellfish allergy   Review of Systems Review of Systems  Constitutional: Negative for chills, fatigue and fever.  HENT: Negative for congestion, ear discharge, ear pain, hearing loss, rhinorrhea, sore throat and voice change.   Respiratory: Positive for cough. Negative for choking, chest tightness, shortness of breath and wheezing.   Cardiovascular: Negative for chest pain.  Gastrointestinal: Negative for abdominal pain, nausea and vomiting.  Musculoskeletal: Negative for arthralgias.  Skin: Positive for rash.     Physical Exam Updated Vital Signs BP (!) 145/91 (BP Location: Right Arm)   Pulse 73   Temp 98.5 F (36.9 C) (Oral)   Resp 18   SpO2 97%   Physical Exam  Constitutional: She appears well-developed and well-nourished. No distress.  HENT:  Head: Normocephalic and atraumatic.  Right Ear: External ear normal.  Left Ear: External ear normal.  Mouth/Throat: Oropharynx is clear and moist. No oropharyngeal exudate.  Bilateral TMs with good cone of light. Clear rhinorrhea noted in the nasal turbinates.  Eyes: Pupils are equal, round, and reactive to  light. Conjunctivae are normal. Right eye exhibits no discharge. Left eye exhibits no discharge. No scleral icterus.  Neck: Normal range of motion. Neck supple.  Cardiovascular: Normal rate and regular rhythm.  Exam reveals no friction rub.   No murmur heard. Pulmonary/Chest: Effort normal and breath sounds normal. No respiratory distress. She has no wheezes. She has no rales.  Lymphadenopathy:    She has no cervical adenopathy.  Neurological:  She is alert. Coordination normal.  Skin: Skin is warm and dry. She is not diaphoretic.  Bilateral forearms with small patches of dry skin. Left ulnar aspect of the hand with erythematous area, cracked no vesicles (see picture below.) Bilateral dorsal foot and posterior lower leg with several small patchy areas noted. No overlying erythema, warmth. See picture below.   Psychiatric: She has a normal mood and affect. Her behavior is normal.  Nursing note and vitals reviewed.          ED Treatments / Results  Labs (all labs ordered are listed, but only abnormal results are displayed) Labs Reviewed - No data to display  EKG  EKG Interpretation None       Radiology Dg Chest 2 View  Result Date: 08/23/2017 CLINICAL DATA:  Cough x2 weeks EXAM: CHEST  2 VIEW COMPARISON:  07/03/2017 FINDINGS: Lungs are clear.  No pleural effusion or pneumothorax. The heart is normal in size. Visualized osseous structures are within normal limits. IMPRESSION: Normal chest radiographs. Electronically Signed   By: Charline Bills M.D.   On: 08/23/2017 11:41    Procedures Procedures (including critical care time)  Medications Ordered in ED Medications - No data to display   Initial Impression / Assessment and Plan / ED Course  I have reviewed the triage vital signs and the nursing notes.  Pertinent labs & imaging results that were available during my care of the patient were reviewed by me and considered in my medical decision making (see chart for details).    Pt CXR negative for acute infiltrate. Patients symptoms are consistent with URI, likely viral etiology. Discussed that antibiotics are not indicated for viral infections. Pt will be discharged with symptomatic treatment. Her rash appears to be dry skin related, no overlying warmth, erythema or induration. Do not suspect infection. The cracked area of erythema noted on her right hand is likely contact dermatitis from wearing gloves at work.  This has been improved in the past with steroid cream and hydrating lotion. Will send patient home with Kenalog cream to apply over this area. Patient has also been counseled on the use of hydrating lotions and creams to help improve patchy areas of dry skin on her arms and legs. Discussed return precautions and patient verbalizes understanding and is agreeable with plan. Pt is hemodynamically stable & in NAD prior to dc.    Final Clinical Impressions(s) / ED Diagnoses   Final diagnoses:  Viral upper respiratory tract infection  Dry skin dermatitis    New Prescriptions New Prescriptions   TRIAMCINOLONE (KENALOG) 0.025 % OINTMENT    Apply 1 application topically 2 (two) times daily.     Kellie Shropshire, PA-C 08/23/17 1335    Gwyneth Sprout, MD 08/23/17 Jerene Bears

## 2017-08-23 NOTE — ED Triage Notes (Signed)
Cough last week with clear mucus and then rash to arms and feet.

## 2017-10-25 ENCOUNTER — Emergency Department (HOSPITAL_COMMUNITY): Payer: Medicaid Other

## 2017-10-25 ENCOUNTER — Encounter (HOSPITAL_COMMUNITY): Payer: Self-pay

## 2017-10-25 ENCOUNTER — Emergency Department (HOSPITAL_COMMUNITY)
Admission: EM | Admit: 2017-10-25 | Discharge: 2017-10-25 | Disposition: A | Discharge status: Home / Self Care | Payer: Medicaid Other | Attending: Emergency Medicine | Admitting: Emergency Medicine

## 2017-10-25 ENCOUNTER — Other Ambulatory Visit: Payer: Self-pay

## 2017-10-25 DIAGNOSIS — M25571 Pain in right ankle and joints of right foot: Secondary | ICD-10-CM

## 2017-10-25 DIAGNOSIS — Z87891 Personal history of nicotine dependence: Secondary | ICD-10-CM | POA: Diagnosis not present

## 2017-10-25 DIAGNOSIS — M79671 Pain in right foot: Secondary | ICD-10-CM

## 2017-10-25 LAB — CBG MONITORING, ED: Glucose-Capillary: 81 mg/dL (ref 65–99)

## 2017-10-25 MED ORDER — MELOXICAM 15 MG PO TABS: 15.0000 mg | ORAL_TABLET | Freq: Every day | ORAL | 0 refills | Status: DC

## 2017-10-25 MED ORDER — IBUPROFEN 800 MG PO TABS
800.0000 mg | ORAL_TABLET | Freq: Once | ORAL | Status: AC
  Administered 2017-10-25: 800 mg via ORAL
  Filled 2017-10-25: qty 1

## 2017-10-25 MED ORDER — PREDNISONE 10 MG (21) PO TBPK: ORAL_TABLET | Freq: Every day | ORAL | 0 refills | Status: DC

## 2017-10-25 NOTE — ED Notes (Signed)
ED Provider at bedside. 

## 2017-10-25 NOTE — ED Triage Notes (Signed)
[facility] with c/o right sided ankle pain and swelling. Pt denies injury, reports she does stand on her feet for work. Unable to tolerate full weight and tender to touch.

## 2017-10-25 NOTE — ED Provider Notes (Signed)
Was running [facility] the [facility]   CSN: [id number] Arrival date & time: 10/25/17  [id number]     History   Chief Complaint Chief Complaint  Patient presents with  . Ankle Pain    HPI Alisha Drake is a 31 y.o. female  31 year old female with chief complaint of right ankle pain.  The patient did have a fall on the snow 2 days ago but does not specifically remember injuring her ankle at that time.  She was able to walk on it normally yesterday overnight she developed severe pain and swelling in the foot and ankle worse on the medial side with significant difficulty ambulating or bearing any weight.  He has no previous history of gout: Patient denies any fevers or chills.  She has had previous history of injury to the ankle.    Ankle Pain      History reviewed. No pertinent past medical history.  There are no active problems to display for this patient.   History reviewed. No pertinent surgical history.  OB History    No data available       Home Medications    Prior to Admission medications   Medication Sig Start Date End Date Taking? Authorizing Provider  triamcinolone (KENALOG) 0.025 % ointment Apply 1 application topically 2 (two) times daily. 08/23/17   Kellie ShropshireShrosbree, Emily J, PA-C    Family History No family history on file.  Social History Social History   Tobacco Use  . Smoking status: Former Smoker    Last attempt to quit: 03/25/2017    Years since quitting: 0.5  . Smokeless tobacco: Never Used  Substance Use Topics  . Alcohol use: No  . Drug use: No     Allergies   Penicillins; Penicillins; and Shellfish allergy   Review of Systems Review of Systems Ten systems reviewed and are negative for acute change, except as noted in the HPI.    Physical Exam Updated Vital Signs BP 130/75 (BP Location: Left Arm)   Pulse 82   Temp 98.3 F (36.8 C) (Oral)   Resp 14   Ht 5\' 3"  (1.6 m)   Wt  129.7 kg (286 lb)   SpO2 97%   BMI 50.66 kg/m   Physical Exam  Constitutional: She is oriented to person, place, and time. She appears well-developed and well-nourished. No distress.  HENT:  Head: Normocephalic and atraumatic.  Eyes: Conjunctivae are normal. No scleral icterus.  Neck: Normal range of motion.  Cardiovascular: Normal rate, regular rhythm and normal heart sounds. Exam reveals no gallop and no friction rub.  No murmur heard. Pulmonary/Chest: Effort normal and breath sounds normal. No respiratory distress.  Abdominal: Soft. Bowel sounds are normal. She exhibits no distension and no mass. There is no tenderness. There is no guarding.  Musculoskeletal:  Patient with swelling noted to the lateral and medial malleolus.  There is exquisite tenderness along the medial side of the ankle.  Less tender along the lateral side.  No tenderness over the lateral malleolus.  There is tenderness over the medial malleolus.  Patient able to dorsiflex the ankle but has severe pain at the height of the dorsiflexion.  Pain with ankle inversion.  Range of motion is limited due to pain.  There is tenderness along the plantar surface.  Neurological: She is alert and oriented to person, place, and time.  Skin: Skin is warm and dry. She is not diaphoretic.  Psychiatric: Her behavior is  normal.  Nursing note and vitals reviewed.    ED Treatments / Results  Labs (all labs ordered are listed, but only abnormal results are displayed) Labs Reviewed - No data to display  EKG  EKG Interpretation None       Radiology No results found.  Procedures Procedures (including critical care time)  Medications Ordered in ED Medications - No data to display   Initial Impression / Assessment and Plan / ED Course  I have reviewed the triage vital signs and the nursing notes.  Pertinent labs & imaging results that were available during my care of the patient were reviewed by me and considered in my  medical decision making (see chart for details).       Patient with R ankle pain. She is able to ambulate. I doubt lisfranc Injury.This may represent gout or potential sprain. Will treat as gout and have the patient follow up with Ortho.  I doubt infection as the patient's risk factors are minimal. Discussed return precaution.   Final Clinical Impressions(s) / ED Diagnoses   Final diagnoses:  Acute right ankle pain  Foot pain, right    ED Discharge Orders    None       Arthor CaptainHarris, Phelan Schadt, PA-C 10/25/17 0945    Lorre NickAllen, Anthony, MD 10/27/17 [phone]

## 2017-10-25 NOTE — Discharge Instructions (Signed)
Contact a health care provider if: °Your pain gets worse. °Your pain is not relieved with medicines. °You have a fever or chills. °You are having more trouble with walking. °You have new symptoms. °Get help right away if: °Your foot, leg, toes, or ankle tingles or becomes numb. °Your foot, leg, toes, or ankle becomes swollen. °Your foot, leg, toes, or ankle turns pale or blue. °

## 2017-10-25 NOTE — ED Notes (Signed)
Ortho at bedside.

## 2017-10-25 NOTE — Progress Notes (Signed)
Orthopedic Tech Progress Note Patient Details:  Alisha NettlesYolanda E Drake 02/10/1986 [id number]  Ortho Devices Type of Ortho Device: Crutches, ASO Ortho Device/Splint Interventions: Application   Post Interventions Patient Tolerated: Well, Ambulated well Instructions Provided: Poper ambulation with device, Adjustment of device   Saul FordyceJennifer C Reyce Lubeck 10/25/2017, 10:09 AM

## 2017-10-25 NOTE — ED Notes (Signed)
After speaking with pt, she reports she does remember slipping on black ice on Thursday.

## 2018-02-15 ENCOUNTER — Inpatient Hospital Stay (HOSPITAL_COMMUNITY)
Admission: AD | Admit: 2018-02-15 | Discharge: 2018-02-16 | Disposition: A | Discharge status: Home / Self Care | Payer: Medicaid Other | Source: Ambulatory Visit | Attending: Obstetrics & Gynecology | Admitting: Obstetrics & Gynecology

## 2018-02-15 DIAGNOSIS — R42 Dizziness and giddiness: Secondary | ICD-10-CM | POA: Insufficient documentation

## 2018-02-15 DIAGNOSIS — N921 Excessive and frequent menstruation with irregular cycle: Secondary | ICD-10-CM | POA: Insufficient documentation

## 2018-02-15 DIAGNOSIS — Z880 Allergy status to penicillin: Secondary | ICD-10-CM | POA: Insufficient documentation

## 2018-02-15 DIAGNOSIS — F17210 Nicotine dependence, cigarettes, uncomplicated: Secondary | ICD-10-CM | POA: Insufficient documentation

## 2018-02-16 ENCOUNTER — Encounter (HOSPITAL_COMMUNITY): Payer: Self-pay | Admitting: *Deleted

## 2018-02-16 DIAGNOSIS — F17210 Nicotine dependence, cigarettes, uncomplicated: Secondary | ICD-10-CM | POA: Diagnosis not present

## 2018-02-16 DIAGNOSIS — Z880 Allergy status to penicillin: Secondary | ICD-10-CM | POA: Diagnosis not present

## 2018-02-16 DIAGNOSIS — N921 Excessive and frequent menstruation with irregular cycle: Secondary | ICD-10-CM | POA: Diagnosis not present

## 2018-02-16 DIAGNOSIS — R42 Dizziness and giddiness: Secondary | ICD-10-CM | POA: Diagnosis present

## 2018-02-16 LAB — URINALYSIS, COMPLETE (UACMP) WITH MICROSCOPIC
BILIRUBIN URINE: NEGATIVE
Glucose, UA: NEGATIVE mg/dL
KETONES UR: NEGATIVE mg/dL
LEUKOCYTES UA: NEGATIVE
NITRITE: NEGATIVE
PROTEIN: 30 mg/dL — AB
Specific Gravity, Urine: 1.026 (ref 1.005–1.030)
pH: 5 (ref 5.0–8.0)

## 2018-02-16 LAB — CBC
HCT: 41.8 % (ref 36.0–46.0)
HEMOGLOBIN: 15.4 g/dL — AB (ref 12.0–15.0)
MCH: 26.9 pg (ref 26.0–34.0)
MCHC: 36.8 g/dL — ABNORMAL HIGH (ref 30.0–36.0)
MCV: 73.1 fL — ABNORMAL LOW (ref 78.0–100.0)
PLATELETS: 287 10*3/uL (ref 150–400)
RBC: 5.72 MIL/uL — ABNORMAL HIGH (ref 3.87–5.11)
RDW: 15.1 % (ref 11.5–15.5)
WBC: 8.9 10*3/uL (ref 4.0–10.5)

## 2018-02-16 LAB — GLUCOSE, CAPILLARY: Glucose-Capillary: 91 mg/dL (ref 65–99)

## 2018-02-16 LAB — HCG, SERUM, QUALITATIVE: PREG SERUM: NEGATIVE

## 2018-02-16 NOTE — Discharge Instructions (Signed)
°--  [facility]: [phone].   Menorrhagia Menorrhagia is when your menstrual periods are heavy or last longer than usual. Follow these instructions at home:  Only take medicine as told by your doctor.  Take any iron pills as told by your doctor. Heavy bleeding may cause low levels of iron in your body.  Do not take aspirin 1 week before or during your period. Aspirin can make the bleeding worse.  Lie down for a while if you change your tampon or pad more than once in 2 hours. This may help lessen the bleeding.  Eat a healthy diet and foods with iron. These foods include leafy green vegetables, meat, liver, eggs, and whole grain breads and cereals.  Do not try to lose weight. Wait until the heavy bleeding has stopped and your iron level is normal. Contact a doctor if:  You soak through a pad or tampon every 1 or 2 hours, and this happens every time you have a period.  You need to use pads and tampons at the same time because you are bleeding so much.  You need to change your pad or tampon during the night.  You have a period that lasts for more than 8 days.  You pass clots bigger than 1 inch (2.5 cm) wide.  You have irregular periods that happen more or less often than once a month.  You feel dizzy or pass out (faint).  You feel very weak or tired.  You feel short of breath or feel your heart is beating too fast when you exercise.  You feel sick to your stomach (nausea) and you throw up (vomit) while you are taking your medicine.  You have watery poop (diarrhea) while you are taking your medicine.  You have any problems that may be related to the medicine you are taking. Get help right away if:  You soak through 4 or more pads or tampons in 2 hours.  You have any bleeding while you are pregnant. This information is not intended to replace advice given to you by your health care provider. Make sure you discuss any questions you  have with your health care provider. Document Released: 08/05/2008 Document Revised: 04/03/2016 Document Reviewed: 04/28/2013 Elsevier Interactive Patient Education  2017 [employer]

## 2018-02-16 NOTE — MAU Note (Addendum)
PT  SAYS VAG BLEEDING  STARTED 3-19- .   HAS BLED X3 WEEKS -   NO CYCLE IN FEB.   FEELS  CRAMPS.    BIRTH CONTROL-  INPLAN[place]-  INSERT[place]   [facility] [place] [place] [facility]     [place] [facility]  .   SAYS VAG BLEEDING  STOPPED  TODAY  IN AM -    NO BLEEDING NOW

## 2018-02-16 NOTE — MAU Provider Note (Signed)
Patient Alisha Drake is a 33 y.o.  [phone] non-pregnant female here after feeling dizzy at home around 10 pm. She called EMS and was transported here.  History    Patient states that she was cooking a 10 pm and felt dizzy. She called EMS, who transported her to [facility]. She states that she also had a heavy period for three weeks,which resolved today. She says that EMS also told her that her blood pressure was low and that they were concerned. She says that when she stood up, her blood pressure and pulse  "Sky rocketed" to "140/something" but they didn't tell her what her pulse was.   She says that she has had on-going abdominal pain (suprapubic) and mild cramping since "before December". She has had shortness of breath for about two months. She hasn't seen a doctor about either of these things.   Her main concern today is her dizziness. She ate rice, ice cream and tacos for dinner. Does not think it is because she has not been eating enough.    She has a Nexplanon in place which she says is due to be removed in 2020.   CSN: [id number]  Arrival date and time: 02/15/18 2253   First Provider Initiated Contact with Patient 02/16/18 [phone]      Chief Complaint  Patient presents with  . Vaginal Bleeding    OB History    Gravida  6   Para      Term      Preterm      AB  1   Living  5     SAB  1   TAB      Ectopic      Multiple      Live Births  5           History reviewed. No pertinent past medical history.  History reviewed. No pertinent surgical history.  History reviewed. No pertinent family history.  Social History   Tobacco Use  . Smoking status: Current Every Day Smoker    Packs/day: 0.25    Last attempt to quit: 03/25/2017    Years since quitting: 0.8  . Smokeless tobacco: Never Used  Substance Use Topics  . Alcohol use: Yes    Alcohol/week: 1.2 oz    Types: 2 Glasses of wine per week  . Drug use: No    Allergies:  Allergies  Allergen  Reactions  . Penicillins Hives  . Penicillins Rash  . Shellfish Allergy Rash    Cuts and bleeding in mouth    Medications Prior to Admission  Medication Sig Dispense Refill Last Dose  . haloperidol (HALDOL) 1 MG tablet Take 1 mg by mouth every 8 (eight) hours as needed for agitation.   Past Week at Unknown time  . lamoTRIgine (LAMICTAL) 100 MG tablet Take 100 mg by mouth 2 (two) times daily.   02/15/2018 at Unknown time  . meloxicam (MOBIC) 15 MG tablet Take 1 tablet (15 mg total) by mouth daily. 30 tablet 0 NOT TAKING  . predniSONE (STERAPRED UNI-PAK 21 TAB) 10 MG (21) TBPK tablet Take by mouth daily. Take 6 tabs by mouth daily  for 2 days, then 5 tabs for 2 days, then 4 tabs for 2 days, then 3 tabs for 2 days, 2 tabs for 2 days, then 1 tab by mouth daily for 2 days 42 tablet 0 NOT TAKING  . triamcinolone (KENALOG) 0.025 % ointment Apply 1 application topically 2 (two) times daily.  30 g 0 NOT USING    Review of Systems  HENT: Negative.   Respiratory: Positive for shortness of breath.   Cardiovascular: Negative.   Gastrointestinal: Positive for abdominal pain.  Genitourinary: Positive for vaginal bleeding.  Musculoskeletal: Negative.   Neurological: Negative.   Psychiatric/Behavioral: Negative.    Physical Exam   Blood pressure 115/87, pulse (!) 110, temperature 98.7 F (37.1 C), last menstrual period 01/26/2018.  Physical Exam  Constitutional: She is oriented to person, place, and time. She appears well-developed.  HENT:  Head: Normocephalic.  GI: Soft.  Genitourinary:  Genitourinary Comments: norm  Musculoskeletal: Normal range of motion.  Neurological: She is alert and oriented to person, place, and time.  Skin: Skin is warm and dry.  Psychiatric: She has a normal mood and affect.    MAU Course  Procedures  MDM For many hours, patient was unable to give a urine sample> Qualitative bhcg was negative.  EKG negative, CBC negative (no signs of anemia).  Her blood sugar  was normal by EMS; glucose here was 91.   Orthostatic blood pressures were normal.   UA:  No signs of infection.   Lungs are clear to auscultation bilaterally; no signs of pneumonia or other pathology.  Assessment and Plan   1. Menorrhagia with irregular cycle   2. Dizziness    2. Patient stable for discharge with strong recommendation to establish primary care as well as recommendation [facility] bleeding.   3. PAtient verbalized understanding; all questions answered.   Charlesetta GaribaldiKathryn Lorraine Kooistra 02/16/2018, 6:03 AM

## 2018-02-16 NOTE — MAU Note (Signed)
NOT IN LOBBY 

## 2018-03-17 ENCOUNTER — Emergency Department (HOSPITAL_COMMUNITY)
Admission: EM | Admit: 2018-03-17 | Discharge: 2018-03-17 | Payer: Medicaid Other | Attending: Emergency Medicine | Admitting: Emergency Medicine

## 2018-03-17 ENCOUNTER — Emergency Department (HOSPITAL_COMMUNITY): Payer: Medicaid Other

## 2018-03-17 ENCOUNTER — Encounter (HOSPITAL_COMMUNITY): Payer: Self-pay | Admitting: *Deleted

## 2018-03-17 ENCOUNTER — Other Ambulatory Visit: Payer: Self-pay

## 2018-03-17 DIAGNOSIS — M542 Cervicalgia: Secondary | ICD-10-CM | POA: Diagnosis not present

## 2018-03-17 DIAGNOSIS — F17210 Nicotine dependence, cigarettes, uncomplicated: Secondary | ICD-10-CM | POA: Diagnosis not present

## 2018-03-17 DIAGNOSIS — R51 Headache: Secondary | ICD-10-CM | POA: Insufficient documentation

## 2018-03-17 DIAGNOSIS — Y9389 Activity, other specified: Secondary | ICD-10-CM | POA: Diagnosis not present

## 2018-03-17 DIAGNOSIS — M79604 Pain in right leg: Secondary | ICD-10-CM | POA: Insufficient documentation

## 2018-03-17 DIAGNOSIS — M25551 Pain in right hip: Secondary | ICD-10-CM | POA: Insufficient documentation

## 2018-03-17 DIAGNOSIS — Z79899 Other long term (current) drug therapy: Secondary | ICD-10-CM | POA: Diagnosis not present

## 2018-03-17 DIAGNOSIS — Y998 Other external cause status: Secondary | ICD-10-CM | POA: Insufficient documentation

## 2018-03-17 DIAGNOSIS — M25511 Pain in right shoulder: Secondary | ICD-10-CM | POA: Insufficient documentation

## 2018-03-17 DIAGNOSIS — M25571 Pain in right ankle and joints of right foot: Secondary | ICD-10-CM | POA: Diagnosis not present

## 2018-03-17 DIAGNOSIS — Y92410 Unspecified street and highway as the place of occurrence of the external cause: Secondary | ICD-10-CM | POA: Insufficient documentation

## 2018-03-17 DIAGNOSIS — F1210 Cannabis abuse, uncomplicated: Secondary | ICD-10-CM | POA: Diagnosis not present

## 2018-03-17 DIAGNOSIS — M25561 Pain in right knee: Secondary | ICD-10-CM | POA: Insufficient documentation

## 2018-03-17 HISTORY — DX: Dissociative identity disorder: F44.81

## 2018-03-17 HISTORY — DX: Bipolar disorder, unspecified: F31.9

## 2018-03-17 HISTORY — DX: Brief psychotic disorder: F23

## 2018-03-17 LAB — POC URINE PREG, ED: PREG TEST UR: NEGATIVE
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
## 2018-03-17 NOTE — ED Notes (Signed)
Pt assisted to restroom with w/c 
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
## 2019-04-09 IMAGING — CR DG CHEST 2V
2 series · 2 of 2 positions shown · non-contrast
Comparison: None.

CLINICAL DATA: Shortness of Breath

EXAM:
CHEST  2 VIEW

[chest pa]
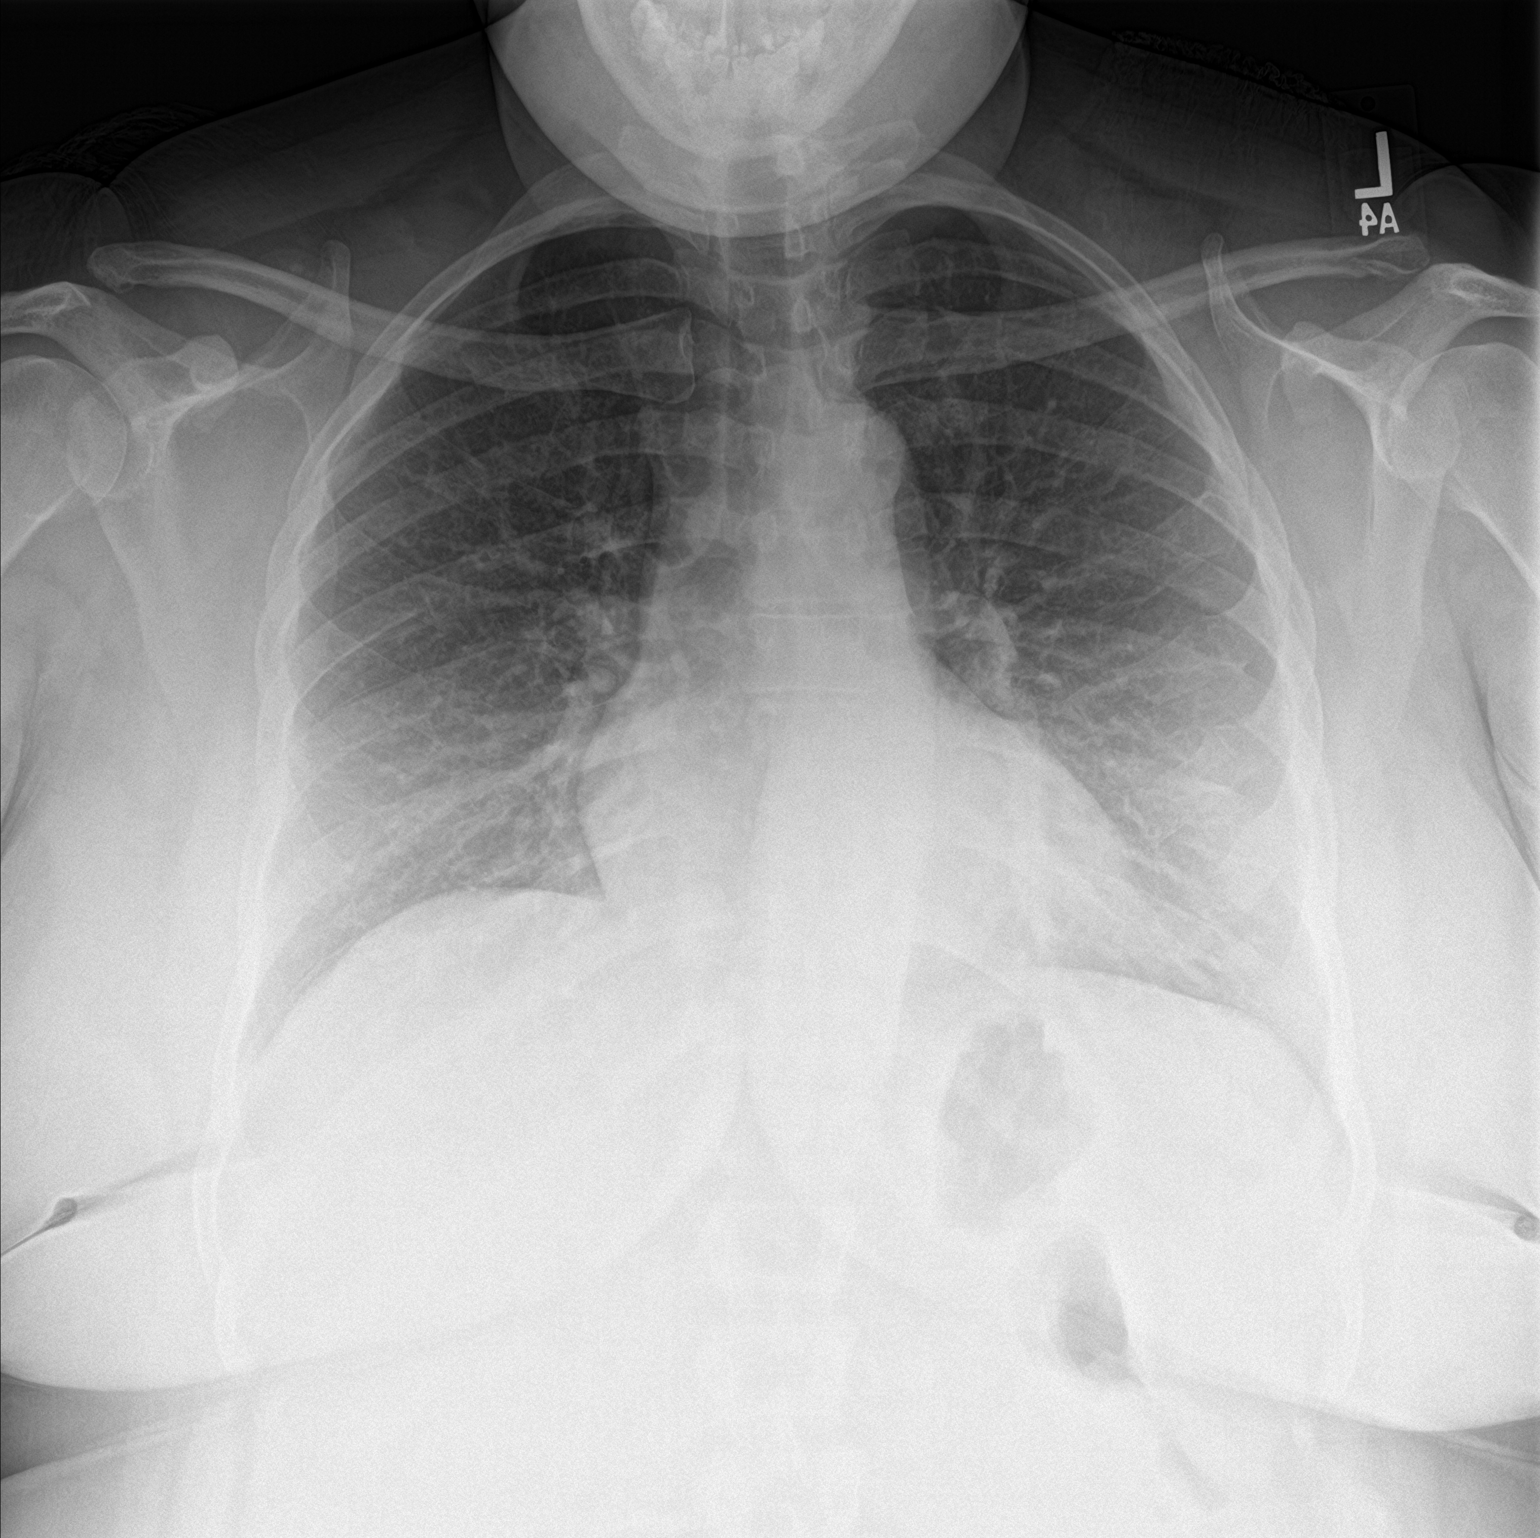

[chest lat]
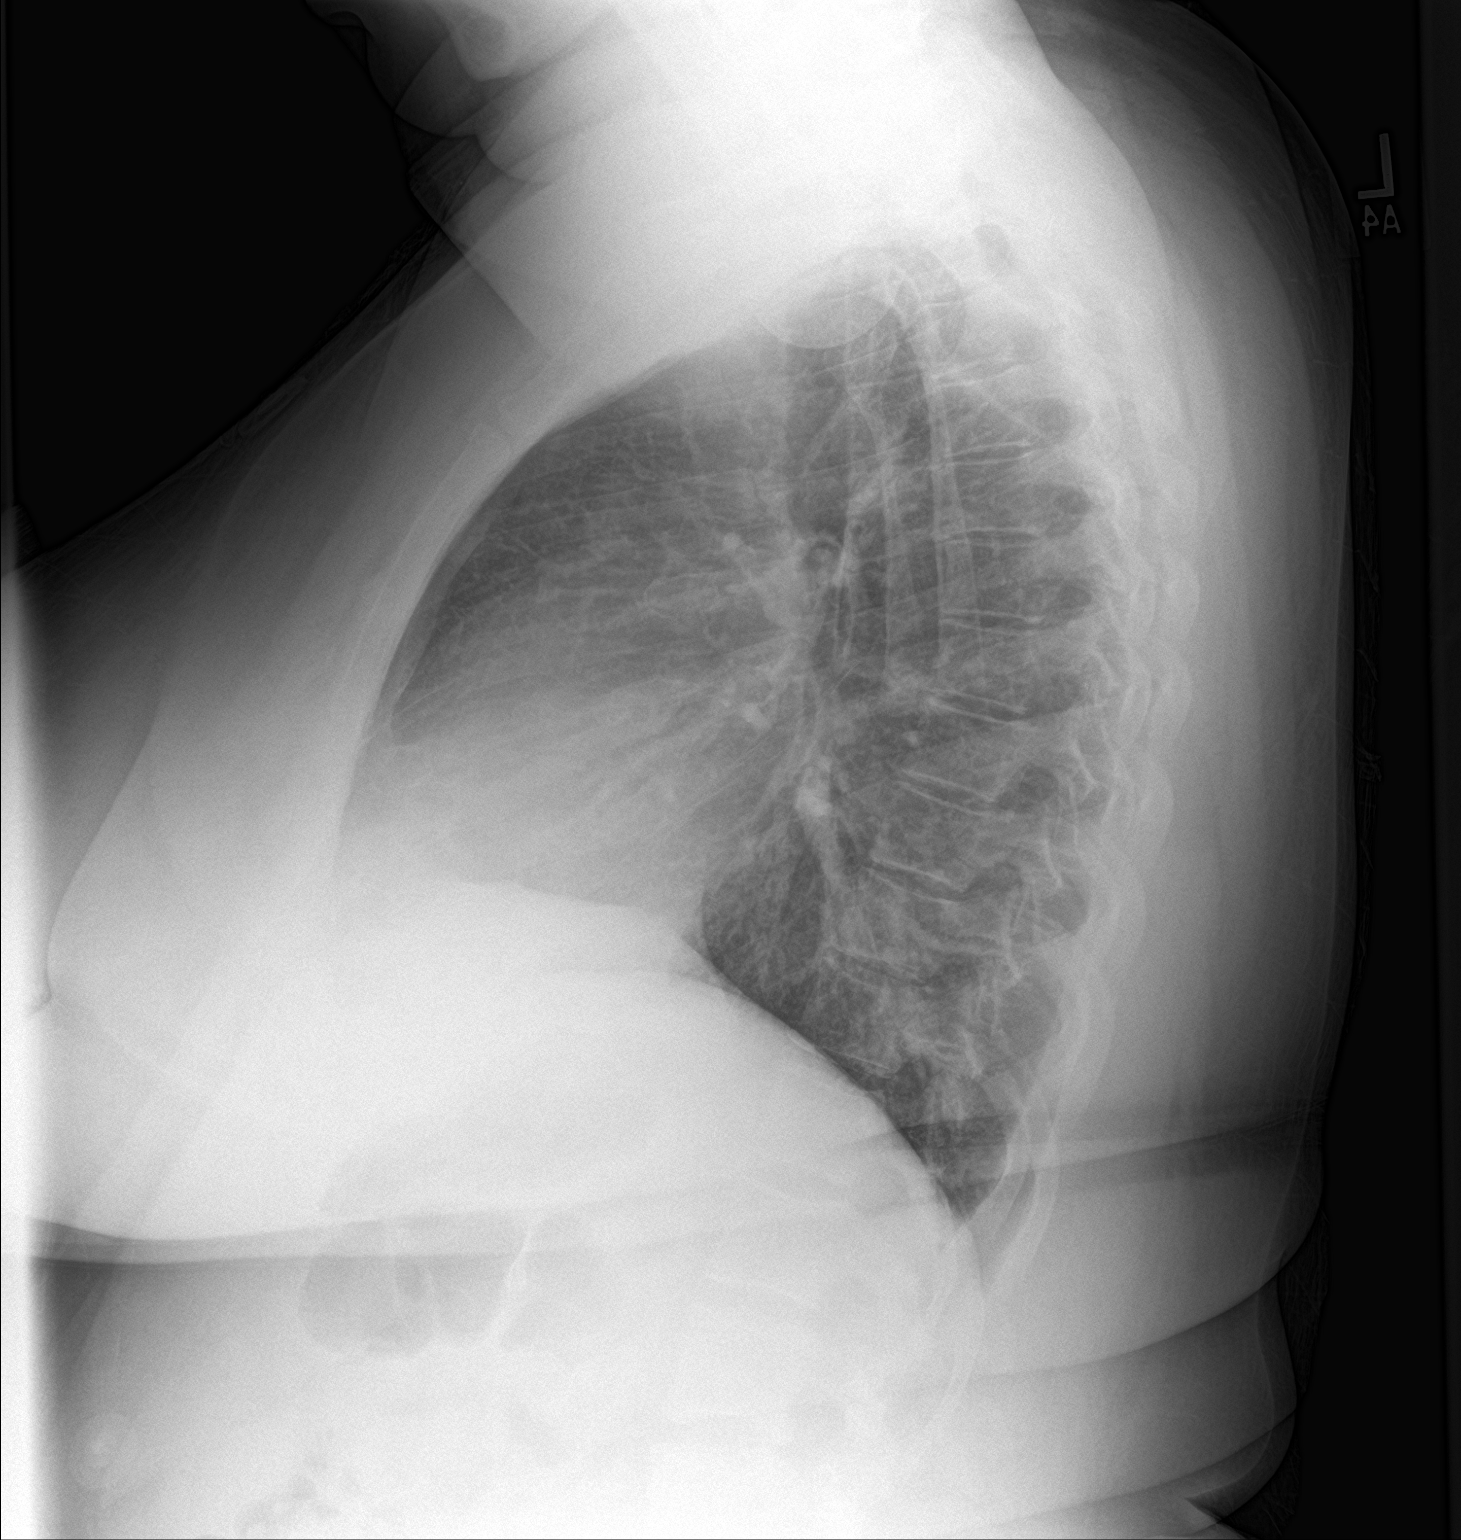

[2 of 2 positions shown; findings below may reference images not displayed]

FINDINGS: Lungs are clear. Heart size and pulmonary vascularity are normal. No
adenopathy. No pneumothorax. No bone lesions.
IMPRESSION: No edema or consolidation.
